# Patient Record
Sex: Male | Born: 1970 | Race: White | Hispanic: No | Marital: Married | Smoking: Former smoker
Health system: Southern US, Community
[De-identification: ages and names within clinical notes are randomized; demographics above are authoritative.]

## PROBLEM LIST (undated history)

## (undated) DIAGNOSIS — F419 Anxiety disorder, unspecified: Secondary | ICD-10-CM

---

## 2002-02-06 ENCOUNTER — Encounter: Payer: Self-pay | Admitting: Emergency Medicine

## 2002-02-06 ENCOUNTER — Emergency Department (HOSPITAL_COMMUNITY): Admission: EM | Admit: 2002-02-06 | Discharge: 2002-02-06 | Payer: Self-pay | Admitting: Emergency Medicine

## 2007-10-16 ENCOUNTER — Emergency Department (HOSPITAL_COMMUNITY): Admission: EM | Admit: 2007-10-16 | Discharge: 2007-10-16 | Payer: Self-pay | Admitting: Emergency Medicine

## 2011-02-22 ENCOUNTER — Emergency Department (HOSPITAL_COMMUNITY)
Admission: EM | Admit: 2011-02-22 | Discharge: 2011-02-22 | Discharge status: Against Medical Advice | Payer: Self-pay | Attending: Emergency Medicine | Admitting: Emergency Medicine

## 2011-02-22 DIAGNOSIS — Z0389 Encounter for observation for other suspected diseases and conditions ruled out: Secondary | ICD-10-CM | POA: Insufficient documentation

## 2014-01-04 ENCOUNTER — Emergency Department (HOSPITAL_COMMUNITY)
Admission: EM | Admit: 2014-01-04 | Discharge: 2014-01-04 | Disposition: A | Discharge status: Home / Self Care | Payer: Self-pay | Attending: Emergency Medicine | Admitting: Emergency Medicine

## 2014-01-04 ENCOUNTER — Encounter (HOSPITAL_COMMUNITY): Payer: Self-pay | Admitting: Emergency Medicine

## 2014-01-04 ENCOUNTER — Emergency Department (HOSPITAL_COMMUNITY): Payer: Self-pay

## 2014-01-04 DIAGNOSIS — X500XXA Overexertion from strenuous movement or load, initial encounter: Secondary | ICD-10-CM | POA: Insufficient documentation

## 2014-01-04 DIAGNOSIS — S43005A Unspecified dislocation of left shoulder joint, initial encounter: Secondary | ICD-10-CM

## 2014-01-04 DIAGNOSIS — Z8659 Personal history of other mental and behavioral disorders: Secondary | ICD-10-CM | POA: Insufficient documentation

## 2014-01-04 DIAGNOSIS — Z87891 Personal history of nicotine dependence: Secondary | ICD-10-CM | POA: Insufficient documentation

## 2014-01-04 DIAGNOSIS — S43016A Anterior dislocation of unspecified humerus, initial encounter: Secondary | ICD-10-CM | POA: Insufficient documentation

## 2014-01-04 DIAGNOSIS — Z880 Allergy status to penicillin: Secondary | ICD-10-CM | POA: Insufficient documentation

## 2014-01-04 DIAGNOSIS — Y929 Unspecified place or not applicable: Secondary | ICD-10-CM | POA: Insufficient documentation

## 2014-01-04 DIAGNOSIS — Y9389 Activity, other specified: Secondary | ICD-10-CM | POA: Insufficient documentation

## 2014-01-04 HISTORY — DX: Anxiety disorder, unspecified: F41.9

## 2014-01-04 MED ORDER — HYDROCODONE-ACETAMINOPHEN 5-325 MG PO TABS: 1.0000 | ORAL_TABLET | Freq: Four times a day (QID) | ORAL | Status: AC | PRN

## 2014-01-04 NOTE — ED Notes (Signed)
Pt alert, arrives from home, c/o left shoulder pain, onset was pta, pt "dislocated while opening a door", PMS intact, IV est per EMS, 250 mcg Fentanyl given

## 2014-01-04 NOTE — ED Provider Notes (Signed)
CSN: [id number]     Arrival date & time 01/04/14  1720 History   First MD Initiated Contact with Patient 01/04/14 1735     Chief Complaint  Patient presents with  . Shoulder Injury     (Consider location/radiation/quality/duration/timing/severity/associated sxs/prior Treatment) HPI Comments: L shoulder dislocated after reaching overhead.  Patient is a 43 y.o. male presenting with shoulder injury. The history is provided by the patient.  Shoulder Injury This is a recurrent problem. The current episode started less than 1 hour ago. The problem occurs constantly. The problem has not changed since onset.Pertinent negatives include no chest pain, no abdominal pain and no shortness of breath. Exacerbated by: shoulder movement. Nothing relieves the symptoms. He has tried nothing for the symptoms.    Past Medical History  Diagnosis Date  . Anxiety    History reviewed. No pertinent past surgical history. No family history on file. History  Substance Use Topics  . Smoking status: Former Smoker    Types: Cigarettes  . Smokeless tobacco: Not on file  . Alcohol Use: No    Review of Systems  Constitutional: Negative for fever and chills.  Respiratory: Negative for cough and shortness of breath.   Cardiovascular: Negative for chest pain.  Gastrointestinal: Negative for vomiting and abdominal pain.  All other systems reviewed and are negative.     Allergies  Penicillins  Home Medications   Prior to Admission medications   Not on File   BP 163/99  Pulse 75  Temp(Src) 98 F (36.7 C) (Oral)  Resp 16  Wt 150 lb (68.04 kg)  SpO2 99% Physical Exam  Nursing note and vitals reviewed. Constitutional: He is oriented to person, place, and time. He appears well-developed and well-nourished. No distress.  HENT:  Head: Normocephalic and atraumatic.  Mouth/Throat: No oropharyngeal exudate.  Eyes: EOM are normal. Pupils are equal, round, and reactive to light.  Neck: Normal range of  motion. Neck supple.  Cardiovascular: Normal rate and regular rhythm.  Exam reveals no friction rub.   No murmur heard. Pulmonary/Chest: Effort normal and breath sounds normal. No respiratory distress. He has no wheezes. He has no rales.  Abdominal: He exhibits no distension. There is no tenderness. There is no rebound.  Musculoskeletal: He exhibits no edema.       Left shoulder: He exhibits decreased range of motion and deformity (c/w shoulder dislocation).  Sensation intact, pulses normal.  Neurological: He is alert and oriented to person, place, and time.  Skin: He is not diaphoretic.    ED Course  ORTHOPEDIC INJURY TREATMENT Date/Time: 01/04/2014 5:48 PM Performed by: Dagmar HaitWALDEN, WILLIAM Cherisa Brucker Authorized by: Dagmar HaitWALDEN, WILLIAM Gurjit Loconte Consent: Verbal consent obtained. Injury location: shoulder Location details: left shoulder Injury type: dislocation Dislocation type: anterior Chronicity: recurrent Pre-procedure neurovascular assessment: neurovascularly intact Pre-procedure distal perfusion: normal Pre-procedure neurological function: normal Pre-procedure range of motion: reduced Local anesthesia used: no Patient sedated: no Manipulation performed: yes Reduction method: traction and counter traction Reduction successful: yes Immobilization: sling   (including critical care time) Labs Review Labs Reviewed - No data to display  Imaging Review Dg Shoulder Left  01/04/2014   CLINICAL DATA:  Left shoulder injury and pain.  EXAM: LEFT SHOULDER - 2+ VIEW  COMPARISON:  None.  FINDINGS: There is no evidence of fracture or dislocation. There is no evidence of arthropathy or other focal bone abnormality. Soft tissues are unremarkable.  IMPRESSION: Negative.   Electronically Signed   By: Myles RosenthalJohn  Stahl M.D.   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 Dislocated 30 minutes ago while reaching overhead. 5 doses of fentanyl with EMS. Reduced easily at bedside after initial evaluation c/w deformity, NVI. Traction/counter traction performed with successful reduction. NVI after reduction. Sling given.     Dagmar HaitWilliam Faelynn Wynder, MD 01/04/14 [phone]

## 2014-01-04 NOTE — ED Notes (Signed)
Bed: [id number] Expected date:  Expected time:  Means of arrival:  Comments: EMS-shoulder dislocation

## 2014-01-04 NOTE — Discharge Instructions (Signed)

## 2014-01-04 NOTE — ED Notes (Signed)
Pt returns to room 

## 2014-01-04 NOTE — ED Notes (Signed)
MD bedside

## 2014-01-04 NOTE — ED Notes (Signed)
[facility] @ bedside  

## 2014-01-04 NOTE — ED Notes (Signed)
Pt transported to xray via stretcher with Tech 

## 2015-04-05 IMAGING — CR DG SHOULDER 2+V*L*
2 series · 2 of 2 positions shown · non-contrast
Comparison: None.

CLINICAL DATA: Left shoulder injury and pain.

EXAM:
LEFT SHOULDER - 2+ VIEW

[x shoulder ap left]
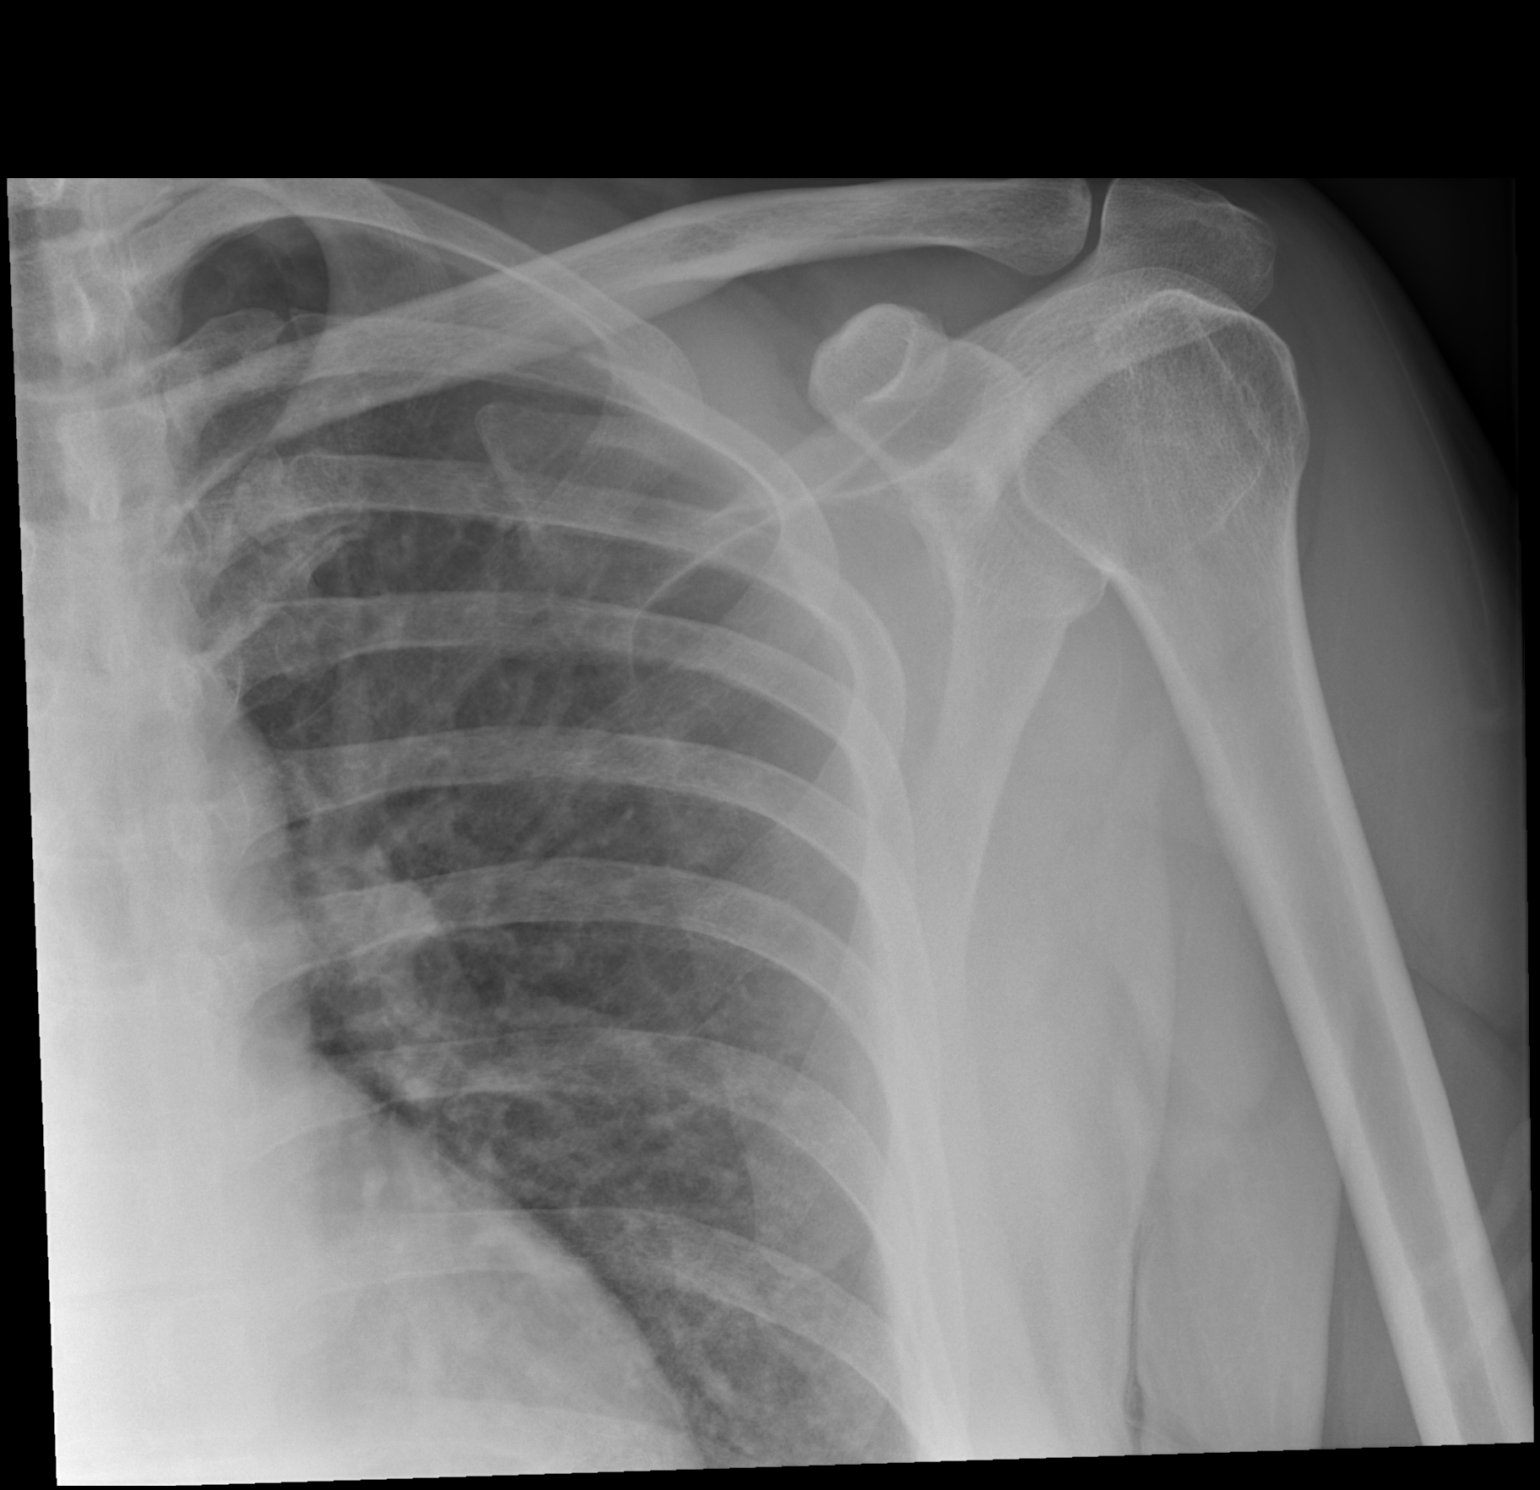

[x shoulder axillary left]
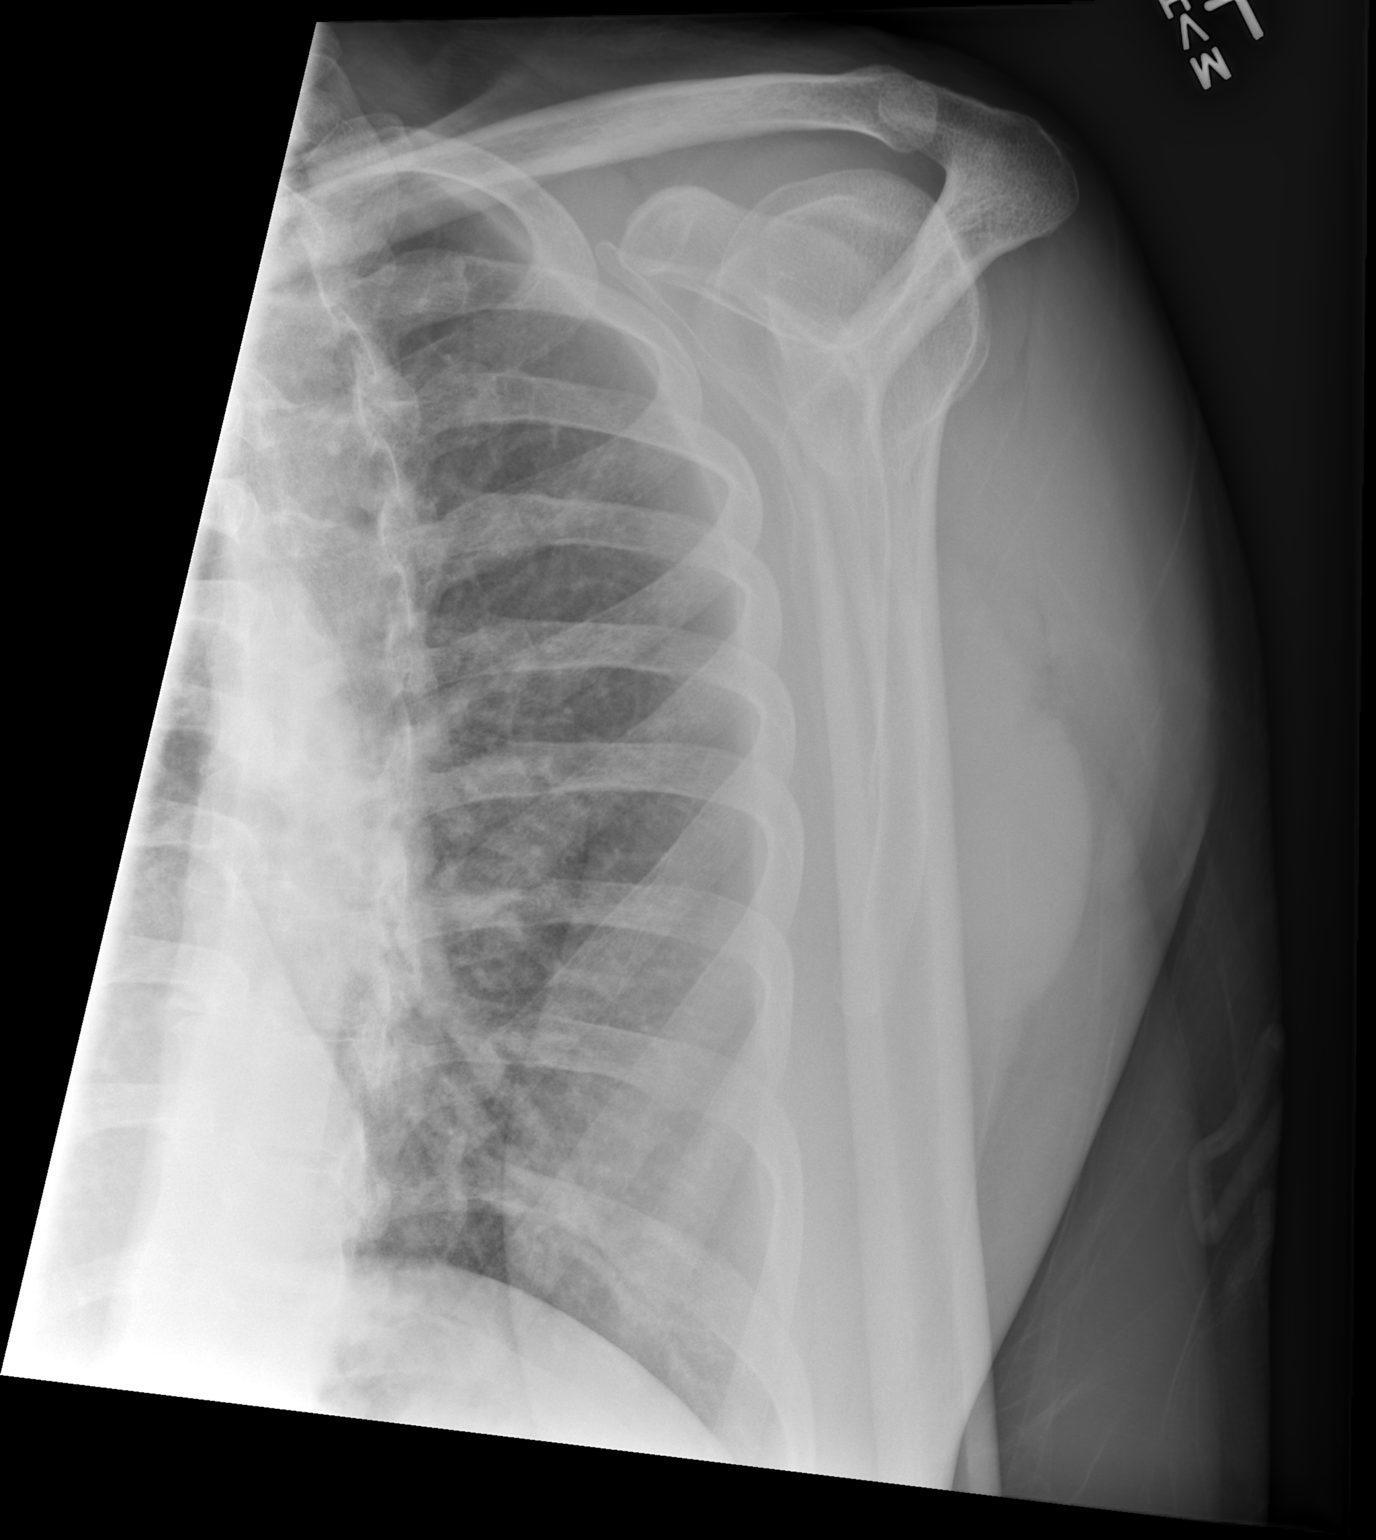

[2 of 2 positions shown; findings below may reference images not displayed]

FINDINGS: There is no evidence of fracture or dislocation. There is no
evidence of arthropathy or other focal bone abnormality. Soft
tissues are unremarkable.
IMPRESSION: Negative.
